# Patient Record
Sex: Female | Born: 1940
Health system: Southern US, Community
[De-identification: ages and names within clinical notes are randomized; demographics above are authoritative.]

## PROBLEM LIST (undated history)

## (undated) DIAGNOSIS — I10 Essential (primary) hypertension: Secondary | ICD-10-CM

## (undated) DIAGNOSIS — J449 Chronic obstructive pulmonary disease, unspecified: Secondary | ICD-10-CM

## (undated) DIAGNOSIS — F32A Depression, unspecified: Secondary | ICD-10-CM

## (undated) DIAGNOSIS — G4730 Sleep apnea, unspecified: Secondary | ICD-10-CM

## (undated) HISTORY — DX: Sleep apnea, unspecified: G47.30

## (undated) HISTORY — PX: ABDOMINAL HYSTERECTOMY: SHX81

## (undated) HISTORY — PX: BACK SURGERY: SHX140

## (undated) HISTORY — DX: Depression, unspecified: F32.A

---

## 2011-05-17 ENCOUNTER — Ambulatory Visit: Payer: Self-pay | Admitting: Internal Medicine

## 2014-03-19 ENCOUNTER — Ambulatory Visit: Payer: Self-pay | Admitting: Emergency Medicine

## 2021-01-29 ENCOUNTER — Other Ambulatory Visit: Payer: Self-pay

## 2021-01-29 ENCOUNTER — Ambulatory Visit
Admission: EM | Admit: 2021-01-29 | Discharge: 2021-01-29 | Disposition: A | Discharge status: Home / Self Care | Payer: BLUE CROSS/BLUE SHIELD

## 2021-01-29 ENCOUNTER — Encounter: Payer: Self-pay | Admitting: Emergency Medicine

## 2021-01-29 DIAGNOSIS — M546 Pain in thoracic spine: Secondary | ICD-10-CM | POA: Diagnosis not present

## 2021-01-29 DIAGNOSIS — M25552 Pain in left hip: Secondary | ICD-10-CM

## 2021-01-29 DIAGNOSIS — M25512 Pain in left shoulder: Secondary | ICD-10-CM | POA: Diagnosis not present

## 2021-01-29 DIAGNOSIS — M5432 Sciatica, left side: Secondary | ICD-10-CM | POA: Diagnosis not present

## 2021-01-29 DIAGNOSIS — J189 Pneumonia, unspecified organism: Secondary | ICD-10-CM

## 2021-01-29 HISTORY — DX: Essential (primary) hypertension: I10

## 2021-01-29 HISTORY — DX: Chronic obstructive pulmonary disease, unspecified: J44.9

## 2021-01-29 MED ORDER — BACLOFEN 10 MG PO TABS: 10.0000 mg | ORAL_TABLET | Freq: Every evening | ORAL | 0 refills | Status: AC

## 2021-01-29 MED ORDER — DICLOFENAC SODIUM 50 MG PO TBEC: 50.0000 mg | DELAYED_RELEASE_TABLET | Freq: Two times a day (BID) | ORAL | 0 refills | Status: AC

## 2021-01-29 MED ORDER — TRAMADOL HCL 50 MG PO TABS: 50.0000 mg | ORAL_TABLET | Freq: Every evening | ORAL | 0 refills | Status: AC

## 2021-01-29 NOTE — Discharge Instructions (Addendum)
BACK PAIN: Stressed avoiding painful activities . RICE (REST, ICE, COMPRESSION, ELEVATION) guidelines reviewed. May alternate ice and heat. Consider use of muscle rubs, Salonpas patches, etc. Use medications as directed including muscle relaxers if prescribed. Take anti-inflammatory medications as prescribed or OTC NSAIDs/Tylenol.  F/u with PCP in 7-10 days for reexamination, and please feel free to call or return to the urgent care at any time for any questions or concerns you may have and we will be happy to help you!   BACK PAIN RED FLAGS: If the back pain acutely worsens or there are any red flag symptoms such as numbness/tingling, leg weakness, saddle anesthesia, or loss of bowel/bladder control, go immediately to the ER. Follow up with [place] as scheduled or sooner if the pain does not begin to resolve or if it worsens before the follow up    PLEASE FOLLOW UP WITH YOUR PCP WHEN YOU GET HOME. DO NOT TAKE ULTRAM OR BACLOFEN WHEN DRIVING, TAKE AT NIGHT TIME ONLY. YOU CAN TAKE DICLOFENAC AND TYLENOL DURING DAY AND USE LIDOCAINE PATCHES AND SALONPAS.
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
## 2021-05-19 ENCOUNTER — Other Ambulatory Visit: Payer: Self-pay

## 2021-05-19 ENCOUNTER — Ambulatory Visit (INDEPENDENT_AMBULATORY_CARE_PROVIDER_SITE_OTHER): Payer: Medicare Other | Admitting: Internal Medicine

## 2021-05-19 ENCOUNTER — Encounter: Payer: Self-pay | Admitting: Internal Medicine

## 2021-05-19 VITALS — BP 154/53 | HR 81 | Temp 97.7°F | Resp 20 | Ht 62.5 in | Wt 154.6 lb

## 2021-05-19 DIAGNOSIS — F5104 Psychophysiologic insomnia: Secondary | ICD-10-CM | POA: Diagnosis not present

## 2021-05-19 DIAGNOSIS — J449 Chronic obstructive pulmonary disease, unspecified: Secondary | ICD-10-CM | POA: Insufficient documentation

## 2021-05-19 DIAGNOSIS — I10 Essential (primary) hypertension: Secondary | ICD-10-CM

## 2021-05-19 DIAGNOSIS — E663 Overweight: Secondary | ICD-10-CM | POA: Diagnosis not present

## 2021-05-19 DIAGNOSIS — J410 Simple chronic bronchitis: Secondary | ICD-10-CM | POA: Diagnosis not present

## 2021-05-19 DIAGNOSIS — K219 Gastro-esophageal reflux disease without esophagitis: Secondary | ICD-10-CM | POA: Diagnosis not present

## 2021-05-19 DIAGNOSIS — F32A Depression, unspecified: Secondary | ICD-10-CM | POA: Diagnosis not present

## 2021-05-19 DIAGNOSIS — N3281 Overactive bladder: Secondary | ICD-10-CM | POA: Diagnosis not present

## 2021-05-19 DIAGNOSIS — Z6827 Body mass index (BMI) 27.0-27.9, adult: Secondary | ICD-10-CM | POA: Insufficient documentation

## 2021-05-19 DIAGNOSIS — F419 Anxiety disorder, unspecified: Secondary | ICD-10-CM

## 2021-05-19 DIAGNOSIS — G4700 Insomnia, unspecified: Secondary | ICD-10-CM | POA: Insufficient documentation

## 2021-05-19 DIAGNOSIS — G4733 Obstructive sleep apnea (adult) (pediatric): Secondary | ICD-10-CM | POA: Diagnosis not present

## 2021-05-19 MED ORDER — DIAZEPAM 2 MG PO TABS: 2.0000 mg | ORAL_TABLET | Freq: Every day | ORAL | 0 refills | Status: DC | PRN

## 2021-05-19 MED ORDER — SERTRALINE HCL 100 MG PO TABS: 150.0000 mg | ORAL_TABLET | Freq: Every day | ORAL | 0 refills | Status: AC

## 2021-05-19 MED ORDER — LISINOPRIL 20 MG PO TABS: 20.0000 mg | ORAL_TABLET | Freq: Every day | ORAL | 0 refills | Status: AC

## 2021-05-19 MED ORDER — ALBUTEROL SULFATE HFA 108 (90 BASE) MCG/ACT IN AERS: 1.0000 | INHALATION_SPRAY | RESPIRATORY_TRACT | 2 refills | Status: AC | PRN

## 2021-05-19 MED ORDER — TRAZODONE HCL 150 MG PO TABS: 150.0000 mg | ORAL_TABLET | Freq: Every day | ORAL | 2 refills | Status: AC

## 2021-05-19 NOTE — Assessment & Plan Note (Signed)
Trazodone refilled today.  

## 2021-05-19 NOTE — Assessment & Plan Note (Signed)
She should take Symbicort but is not willing to use Continue Albuterol as needed, refilled today

## 2021-05-19 NOTE — Assessment & Plan Note (Signed)
Encouraged diet and exercise for weight loss ?

## 2021-05-19 NOTE — Assessment & Plan Note (Signed)
Secondary to complicated grief She declines referral for grief counseling Increase Sertraline 250 mg daily Continue Hydroxyzine as needed Rx for Valium 2 mg daily as needed for panic attacks, discussed sedation and addiction caution. Support offered

## 2021-05-19 NOTE — Patient Instructions (Signed)
Complicated Grief Grief is a normal response to the death of someone close to you. Feelings of fear, anger, and guilt can affect almost everyone who loses a loved one. It is also common to have symptoms of depression while you are grieving. These include problems with sleep, loss of appetite, and lack of energy. They maylast for weeks or months after a loss. Complicated grief is different from normal grief or depression. Normal grieving involves sadness and feelings of loss, but those feelings get better and heal over time. Complicated grief is a severe type of grief that lasts for a long time, usually for several months to a year or longer. It interferes with your ability to function normally. Complicated grief may require treatment from amental health care provider. What are the causes? The cause of this condition is not known. It is not clear why some peoplecontinue to struggle with grief and others do not. What increases the risk? You are more likely to develop this condition if: The death of your loved one was sudden or unexpected. The death of your loved one was due to a violent event. Your loved one died from suicide. Your loved one was a child or a young person. You were very close to your loved one, or you were dependent on him or her. You have a history of depression or anxiety. What are the signs or symptoms? Symptoms of this condition include: Feeling disbelief or having a lack of emotion (numbness). Being unable to enjoy good memories of your loved one. Needing to avoid anything or anyone that reminds you of your loved one. Being unable to stop thinking about the death. Feeling intense anger or guilt. Feeling alone and hopeless. Feeling that your life is meaningless and empty. Losing the desire to move on with your life. How is this diagnosed? This condition may be diagnosed based on: Your symptoms. Complicated grief will be diagnosed if you have ongoing symptoms of grief for  6-12 months or longer. The effect of symptoms on your life. You may be diagnosed with this condition if your symptoms are interfering with your ability to live your life. Your health care provider may recommend that you see a mental health care provider. Many symptoms of depression are similar to the symptoms of complicated grief. It is important to be evaluated for complicated grief alongwith other mental health conditions. How is this treated? This condition is most commonly treated with talk therapy. This therapy is offered by a mental health specialist (psychiatrist). During therapy: You will learn healthy ways to cope with the loss of your loved one. Your mental health care provider may recommend antidepressant medicines. Follow these instructions at home: Lifestyle  Take care of yourself. Eat on a regular basis, and maintain a healthy diet. Eat plenty of fruits, vegetables, lean protein, and whole grains. Try to get some exercise each day. Aim for 30 minutes of exercise on most days of the week. Keep a consistent sleep schedule. Try to get 8 or more hours of sleep each night. Start doing the things that you used to enjoy. Do not use drugs or alcohol to ease your symptoms. Spend time with friends and loved ones.  General instructions Take over-the-counter and prescription medicines only as told by your health care provider. Consider joining a grief (bereavement) support group to help you deal with your loss. Keep all follow-up visits as told by your health care provider. This is important. Contact a health care provider if: Your symptoms prevent   you from functioning normally. Your symptoms do not get better with treatment. Get help right away if: You have serious thoughts about hurting yourself or someone else. You have suicidal feelings. If you ever feel like you may hurt yourself or others, or have thoughts about taking your own life, get help right away. You can go to your nearest  emergency department or call: Your local emergency services (911 in the [place] A suicide crisis helpline, such as the National Suicide Prevention Lifeline at 1-800-273-8255. This is open 24 hours a day. Summary Complicated grief is a severe type of grief that lasts for a long time. This grief is not likely to go away on its own. Get the help you need. Some griefs are more difficult than others and can cause this condition. You may need a certain type of treatment to help you recover if the loss of your loved one was sudden, violent, or due to suicide. You may feel guilty about moving on with your life. Getting help does not mean that you are forgetting your loved one. It means that you are taking care of yourself. Complicated grief is best treated with talk therapy. Medicines may also be prescribed. Seek the help you need, and find support that will help you recover. This information is not intended to replace advice given to you by your health care provider. Make sure you discuss any questions you have with your healthcare provider. Document Revised: 04/17/2020 Document Reviewed: 04/17/2020 Elsevier Patient Education  2022 [employer]  

## 2021-05-19 NOTE — Assessment & Plan Note (Signed)
Noncompliant with CPAP 

## 2021-05-19 NOTE — Assessment & Plan Note (Signed)
Continue Ditropan 

## 2021-05-19 NOTE — Progress Notes (Signed)
HPI  Pt presents to the clinic today to establish care and for management of the conditions listed  below. She recently moved from [place].   COPD: She reports chronic shortness of breath. She is no longer taking Symbicort. She takes Albuterol as needed. She no longer smokes. There are no PFT's on file.  OSA: She averages 4 hours of sleep per nigh without the use of her CPAP. She reports she can not tolerate it. There is no sleep study on file.  Insomnia: She has trouble falling and staying asleep. She was taking Trazadone with some good relief, but reports she is out and would like this refilled today. There is no sleep study on file.  Anxiety and Depression: She has lack of motivation, crying spells. She feels anxious and on edge. This started after the death of her daughter and now she is raising her 2 great grandson's, 8 and 10.  She is not interested in seeing a grief counselor. She is taking Sertraline and Hydroxyzine as prescribed. She denies SI/HI.  GERD: She is not sure what triggers this. She denies breakthrough on Esomeprazole. There is no upper GI on file.  HTN: Her BP today is 154/53. She was taking Lisinopril as prescribed but reports she ran out 3 days ago. There is no ECG on file.  OAB: She reports urge and stress incontinence. She is taking Ditropan as prescribed.  Past Medical History:  Diagnosis Date   COPD (chronic obstructive pulmonary disease) (HCC)    Depression    Hypertension    Sleep apnea     Current Outpatient Medications  Medication Sig Dispense Refill   albuterol (VENTOLIN HFA) 108 (90 Base) MCG/ACT inhaler      atorvastatin (LIPITOR) 40 MG tablet Take 40 mg by mouth daily.     esomeprazole (NEXIUM) 40 MG capsule 1 capsule     fluticasone (FLONASE) 50 MCG/ACT nasal spray 1 spray by Each Nare route daily.     hydrOXYzine (ATARAX/VISTARIL) 25 MG tablet 1 tablet as needed     lisinopril (ZESTRIL) 20 MG tablet 1 tablet     oxybutynin (DITROPAN-XL) 10 MG 24  hr tablet Take 10 mg by mouth daily.     sertraline (ZOLOFT) 100 MG tablet Take 100 mg by mouth daily.     traZODone (DESYREL) 150 MG tablet Take 150 mg by mouth at bedtime.     budesonide-formoterol (SYMBICORT) 160-4.5 MCG/ACT inhaler 2 puffs (Patient not taking: Reported on 05/19/2021)     No current facility-administered medications for this visit.    Allergies  Allergen Reactions   Codeine    Sulfa Antibiotics     No family history on file.  Social History   Socioeconomic History   Marital status: Single    Spouse name: Not on file   Number of children: Not on file   Years of education: Not on file   Highest education level: Not on file  Occupational History   Not on file  Tobacco Use   Smoking status: Never   Smokeless tobacco: Never  Vaping Use   Vaping Use: Never used  Substance and Sexual Activity   Alcohol use: Never   Drug use: Never   Sexual activity: Not on file  Other Topics Concern   Not on file  Social History Narrative   Not on file   Social Determinants of Health   Financial Resource Strain: Not on file  Food Insecurity: Not on file  Transportation Needs: Not on file  Physical Activity: Not on file  Stress: Not on file  Social Connections: Not on file  Intimate Partner Violence: Not on file    ROS:  Constitutional: Denies fever, malaise, fatigue, headache or abrupt weight changes.  HEENT: Denies eye pain, eye redness, ear pain, ringing in the ears, wax buildup, runny nose, nasal congestion, bloody nose, or sore throat. Respiratory: Pt reports shortness of breath. Denies difficulty breathing, cough or sputum production.   Cardiovascular: Denies chest pain, chest tightness, palpitations or swelling in the hands or feet.  Gastrointestinal: Denies abdominal pain, bloating, constipation, diarrhea or blood in the stool.  GU: Pt reports urge and stress incontinence. Denies frequency, pain with urination, blood in urine, odor or  discharge. Musculoskeletal: Denies decrease in range of motion, difficulty with gait, muscle pain or joint pain and swelling.  Skin: Denies redness, rashes, lesions or ulcercations.  Neurological: Denies dizziness, difficulty with memory, difficulty with speech or problems with balance and coordination.  Psych: Pt reports anxiety and depression. Denies SI/HI.  No other specific complaints in a complete review of systems (except as listed in HPI above).  PE:  BP (!) 154/53 (BP Location: Left Arm, Patient Position: Sitting, Cuff Size: Normal)   Pulse 81   Temp 97.7 F (36.5 C) (Temporal)   Resp 20   Ht 5' 2.5" (1.588 m)   Wt 154 lb 9.6 oz (70.1 kg)   SpO2 96%   BMI 27.83 kg/m  Wt Readings from Last 3 Encounters:  05/19/21 154 lb 9.6 oz (70.1 kg)  01/29/21 165 lb (74.8 kg)    General: Appears her stated age, chronically ill appearing, in NAD. HEENT: Head: normal shape and size; Eyes: sclera white and EOMs intact;  Neck: Neck supple, trachea midline. No masses, lumps or thyromegaly present.  Cardiovascular: Normal rate and rhythm. S1,S2 noted.  No murmur, rubs or gallops noted.  Pulmonary/Chest: Normal effort and positive vesicular breath sounds. No respiratory distress. No wheezes, rales or ronchi noted.  Abdomen: Soft and nontender. Normal bowel sounds. Musculoskeletal: No difficulty with gait.  Neurological: Alert and oriented.  Psychiatric: Tearful. Judgment and thought content normal.     Assessment and Plan:   Nicki Reaper, NP This visit occurred during the SARS-CoV-2 public health emergency.  Safety protocols were in place, including screening questions prior to the visit, additional usage of staff PPE, and extensive cleaning of exam room while observing appropriate contact time as indicated for disinfecting solutions.

## 2021-05-19 NOTE — Assessment & Plan Note (Signed)
Continue Esomeprazole

## 2021-05-19 NOTE — Assessment & Plan Note (Signed)
Uncontrolled off meds Lisinopril refilled today Reinforced DASH diet and exercise for weight loss

## 2021-05-20 ENCOUNTER — Other Ambulatory Visit: Payer: Self-pay | Admitting: Internal Medicine

## 2021-05-20 MED ORDER — DIAZEPAM 2 MG PO TABS: 2.0000 mg | ORAL_TABLET | Freq: Every day | ORAL | 0 refills | Status: AC | PRN

## 2021-05-20 NOTE — Telephone Encounter (Signed)
Copied from CRM [id number]. Topic: General - Other >> May 19, 2021  2:18 PM Jaquita Rector A wrote: Reason for CRM: [facility] called called in to inform Nicki Reaper that patient have changed to them and need her diazepam (VALIUM) 2 MG tablet sent over to them please since it cant be transferred

## 2021-05-20 NOTE — Telephone Encounter (Signed)
Pt called and stated she uses [facility] and not [facility] for her meds/ pt was able to have her meds transferred using the pharmacy except for diazepam (VALIUM) 2 MG tablet Pt needs this Rx sent to [facility]/ please advise

## 2021-05-20 NOTE — Telephone Encounter (Signed)
I have sent to [facility]. Please call previous pharmacy to cancel as this is a controlled substance.

## 2021-05-22 NOTE — Telephone Encounter (Signed)
I called and disconnected the prescription at the [facility] [place], [place]. For the Diazepam.

## 2021-05-28 ENCOUNTER — Ambulatory Visit: Payer: Self-pay | Admitting: *Deleted

## 2021-05-28 NOTE — Telephone Encounter (Signed)
Reason for Disposition  [1] Thigh, calf, or ankle swelling AND [2] bilateral AND [3] 1 side is more swollen  Answer Assessment - Initial Assessment Questions 1. ONSET: "When did the swelling start?" (e.g., minutes, hours, days)     Yesterday  2. LOCATION: "What part of the leg is swollen?"  "Are both legs swollen or just one leg?"     Bilateral legs feet to knees, left worse than right 3. SEVERITY: "How bad is the swelling?" (e.g., localized; mild, moderate, severe)  - Localized - small area of swelling localized to one leg  - MILD pedal edema - swelling limited to foot and ankle, pitting edema < 1/4 inch (6 mm) deep, rest and elevation eliminate most or all swelling  - MODERATE edema - swelling of lower leg to knee, pitting edema > 1/4 inch (6 mm) deep, rest and elevation only partially reduce swelling  - SEVERE edema - swelling extends above knee, facial or hand swelling present      Mild . Left calf swelling redness and tender to touch  4. REDNESS: "Does the swelling look red or infected?"     Redness to left calf, red rash to right foot 5. PAIN: "Is the swelling painful to touch?" If Yes, ask: "How painful is it?"   (Scale 1-10; mild, moderate or severe)     Yes  6. FEVER: "Do you have a fever?" If Yes, ask: "What is it, how was it measured, and when did it start?"      Denies  7. CAUSE: "What do you think is causing the leg swelling?"     Not sure  8. MEDICAL HISTORY: "Do you have a history of heart failure, kidney disease, liver failure, or cancer?"     Denies  9. RECURRENT SYMPTOM: "Have you had leg swelling before?" If Yes, ask: "When was the last time?" "What happened that time?"     no 10. OTHER SYMPTOMS: "Do you have any other symptoms?" (e.g., chest pain, difficulty breathing)       No chest pain or difficulty breathing, c/o cough and upper resp. Sx scratchy sore throat  11. PREGNANCY: "Is there any chance you are pregnant?" "When was your last menstrual period?"        na  Protocols used: Leg Swelling and Edema-A-AH

## 2021-05-28 NOTE — Telephone Encounter (Signed)
Agree with advice given, since there are no appt available here

## 2021-05-28 NOTE — Telephone Encounter (Signed)
C/o  bilateral legs swelling left greater than right. Feet to knees swollen. Left calf swelling redness and tender to touch. Right top of foot noted with red rash, no itching. Swelling started yesterday. Patient reports "a little tender to stand on left leg" but she is "not worried about it'. Patient reports she is more concerned that she has a scratchy sore throat and getting upper respiratory infection. C/o coughing up light yellow sputum. Hx COPD. Denies fever, CP , SOB or difficulty breathing. No available appt with PCP today or tomorrow. Instructed patient to go to [facility] or ED now. Care advise given. Patient verbalized understanding of care advise and to go to [facility] or  ED or call 911 if symptoms worsen.

## 2021-06-01 ENCOUNTER — Other Ambulatory Visit: Payer: Self-pay

## 2021-06-01 ENCOUNTER — Ambulatory Visit
Admission: RE | Admit: 2021-06-01 | Discharge: 2021-06-01 | Disposition: A | Discharge status: Home / Self Care | Payer: Medicare Other | Source: Ambulatory Visit | Attending: Family Medicine | Admitting: Family Medicine

## 2021-06-01 ENCOUNTER — Ambulatory Visit
Admission: EM | Admit: 2021-06-01 | Discharge: 2021-06-01 | Disposition: A | Discharge status: Home / Self Care | Payer: Medicare Other | Attending: Family Medicine | Admitting: Family Medicine

## 2021-06-01 DIAGNOSIS — R600 Localized edema: Secondary | ICD-10-CM

## 2021-06-01 DIAGNOSIS — M79604 Pain in right leg: Secondary | ICD-10-CM | POA: Insufficient documentation

## 2021-06-01 DIAGNOSIS — M79605 Pain in left leg: Secondary | ICD-10-CM | POA: Diagnosis not present

## 2021-06-01 NOTE — Discharge Instructions (Addendum)
[place] at the hospital.   I will call with the results.  Take care  Dr. Adriana Simas

## 2021-06-01 NOTE — ED Provider Notes (Signed)
[facility]    CSN: [id number] Arrival date & time: 06/01/21  1105      History   Chief Complaint Chief Complaint  Patient presents with   Foot Pain    HPI  80 year old female presents for evaluation of lower extremity edema and back pain.   Patient reports that she has some baseline swelling in her lower extremities.  She states that it was worse on Thursday.  She contacted her primary care physician and was advised to go to the ER or urgent care for evaluation.  Patient presents today for evaluation of this.  Also reports upper back pain.  Patient denies calf pain.  She states that her swelling in her lower extremities is improved.  Improved after rest elevation.  Patient has been using heat to her upper back without resolution.  Denies injury.  She states that it is worse with range of motion.  No other complaints at this time.  Past Medical History:  Diagnosis Date   COPD (chronic obstructive pulmonary disease) (HCC)    Depression    Hypertension    Sleep apnea     Patient Active Problem List   Diagnosis Date Noted   COPD (chronic obstructive pulmonary disease) (HCC) 05/19/2021   OSA (obstructive sleep apnea) 05/19/2021   Insomnia 05/19/2021   Anxiety and depression 05/19/2021   GERD (gastroesophageal reflux disease) 05/19/2021   HTN (hypertension) 05/19/2021   OAB (overactive bladder) 05/19/2021   Overweight with body mass index (BMI) of 27 to 27.9 in adult 05/19/2021    Past Surgical History:  Procedure Laterality Date   ABDOMINAL HYSTERECTOMY     BACK SURGERY     L5    OB History   No obstetric history on file.      Home Medications    Prior to Admission medications   Medication Sig Start Date End Date Taking? Authorizing Provider  albuterol (VENTOLIN HFA) 108 (90 Base) MCG/ACT inhaler Inhale 1-2 puffs into the lungs every 4 (four) hours as needed for wheezing or shortness of breath. 05/19/21  Yes Lorre Munroe, NP  atorvastatin  (LIPITOR) 40 MG tablet Take 40 mg by mouth daily.   Yes [provider]  diazepam (VALIUM) 2 MG tablet Take 1 tablet (2 mg total) by mouth daily as needed for anxiety. 05/20/21  Yes Lorre Munroe, NP  esomeprazole (NEXIUM) 40 MG capsule 1 capsule 04/03/14  Yes [provider]  fluticasone (FLONASE) 50 MCG/ACT nasal spray 1 spray by Each Nare route daily.   Yes [provider]  hydrOXYzine (ATARAX/VISTARIL) 25 MG tablet 1 tablet as needed 03/11/20  Yes [provider]  lisinopril (ZESTRIL) 20 MG tablet Take 1 tablet (20 mg total) by mouth daily. 05/19/21  Yes Lorre Munroe, NP  oxybutynin (DITROPAN-XL) 10 MG 24 hr tablet Take 10 mg by mouth daily. 02/27/21  Yes [provider]  sertraline (ZOLOFT) 100 MG tablet Take 1.5 tablets (150 mg total) by mouth daily. 05/19/21  Yes Lorre Munroe, NP  traZODone (DESYREL) 150 MG tablet Take 1 tablet (150 mg total) by mouth at bedtime. 05/19/21  Yes Lorre Munroe, NP    Family History No family history on file.  Social History Social History   Tobacco Use   Smoking status: Never   Smokeless tobacco: Never  Vaping Use   Vaping Use: Never used  Substance Use Topics   Alcohol use: Never   Drug use: Never  Allergies   Codeine and Sulfa antibiotics   Review of Systems Review of Systems Per HPI  Physical Exam Triage Vital Signs ED Triage Vitals [06/01/21 1147]  Enc Vitals Group     BP (!) 199/64     Pulse Rate 71     Resp 16     Temp 98.2 F (36.8 C)     Temp Source Oral     SpO2 96 %     Weight 170 lb (77.1 kg)     Height      Head Circumference      Peak Flow      Pain Score 7     Pain Loc      Pain Edu?      Excl. in GC?    Updated Vital Signs BP (!) 199/64 (BP Location: Right Arm)   Pulse 71   Temp 98.2 F (36.8 C) (Oral)   Resp 16   Wt 77.1 kg   SpO2 96%   BMI 30.60 kg/m   Visual Acuity Right Eye Distance:   Left Eye Distance:   Bilateral Distance:    Right Eye  Near:   Left Eye Near:    Bilateral Near:     Physical Exam Vitals and nursing note reviewed.  Constitutional:      General: She is not in acute distress.    Appearance: She is not ill-appearing.  HENT:     Head: Normocephalic and atraumatic.  Cardiovascular:     Rate and Rhythm: Normal rate and regular rhythm.  Pulmonary:     Effort: Pulmonary effort is normal.     Breath sounds: Normal breath sounds. No wheezing, rhonchi or rales.  Musculoskeletal:     Comments: Bilateral lower extremities with pitting edema and evidence of chronic changes associated with venous insufficiency.  No significant calf pain on exam.  T spine - mild tenderness of the left paraspinal musculature.   Neurological:     Mental Status: She is alert.  Psychiatric:        Mood and Affect: Mood normal.        Behavior: Behavior normal.    UC Treatments / Results  Labs (all labs ordered are listed, but only abnormal results are displayed) Labs Reviewed - No data to display  EKG   Radiology US Venous Img Lower Bilateral (DVT)  Result Date: 06/01/2021 CLINICAL DATA:  Lower extremity pain and edema for 4 days EXAM: BILATERAL LOWER EXTREMITY VENOUS DOPPLER ULTRASOUND TECHNIQUE: Gray-scale sonography with graded compression, as well as color Doppler and duplex ultrasound were performed to evaluate the lower extremity deep venous systems from the level of the common femoral vein and including the common femoral, femoral, profunda femoral, popliteal and calf veins including the posterior tibial, peroneal and gastrocnemius veins when visible. The superficial great saphenous vein was also interrogated. Spectral Doppler was utilized to evaluate flow at rest and with distal augmentation maneuvers in the common femoral, femoral and popliteal veins. COMPARISON:  None. FINDINGS: RIGHT LOWER EXTREMITY Common Femoral Vein: No evidence of thrombus. Normal compressibility, respiratory phasicity and response to augmentation.  Saphenofemoral Junction: No evidence of thrombus. Normal compressibility and flow on color Doppler imaging. Profunda Femoral Vein: No evidence of thrombus. Normal compressibility and flow on color Doppler imaging. Femoral Vein: No evidence of thrombus. Normal compressibility, respiratory phasicity and response to augmentation. Popliteal Vein: No evidence of thrombus. Normal compressibility, respiratory phasicity and response to augmentation. Calf Veins: No evidence of thrombus. Normal compressibility and flow  on color Doppler imaging. LEFT LOWER EXTREMITY Common Femoral Vein: No evidence of thrombus. Normal compressibility, respiratory phasicity and response to augmentation. Saphenofemoral Junction: No evidence of thrombus. Normal compressibility and flow on color Doppler imaging. Profunda Femoral Vein: No evidence of thrombus. Normal compressibility and flow on color Doppler imaging. Femoral Vein: No evidence of thrombus. Normal compressibility, respiratory phasicity and response to augmentation. Popliteal Vein: No evidence of thrombus. Normal compressibility, respiratory phasicity and response to augmentation. Calf Veins: No evidence of thrombus. Normal compressibility and flow on color Doppler imaging. IMPRESSION: No evidence of deep venous thrombosis in either lower extremity. Electronically Signed   By: Judie Petit.  Shick M.D.   On: 06/01/2021 14:44    Procedures Procedures (including critical care time)  Medications Ordered in UC Medications - No data to display  Initial Impression / Assessment and Plan / UC Course  I have reviewed the triage vital signs and the nursing notes.  Pertinent labs & imaging results that were available during my care of the patient were reviewed by me and considered in my medical decision making (see chart for details).    80 year old female presents with recent worsening of her lower extremity edema.  Also experiencing upper back pain.  Patient was sent for venous ultrasound  which was negative for DVT.  Patient appears to be experiencing venous stasis.  Advised rest and leg elevation.  In regards to her back pain, this appears to be musculoskeletal.  Advised heat and supportive care.  Final Clinical Impressions(s) / UC Diagnoses   Final diagnoses:  Lower extremity edema     Discharge Instructions      [place] at the hospital.   I will call with the results.  Take care  Dr. Adriana Simas    ED Prescriptions   None    PDMP not reviewed this encounter.   Tommie Sams, [place] 06/01/21 2014

## 2021-06-01 NOTE — ED Triage Notes (Signed)
Pt c/o bilateral leg and ankle swelling that started Friday. Today c/o mid back pain that started today, pulling sensation

## 2021-07-01 DIAGNOSIS — J069 Acute upper respiratory infection, unspecified: Secondary | ICD-10-CM | POA: Diagnosis not present

## 2021-07-01 DIAGNOSIS — R112 Nausea with vomiting, unspecified: Secondary | ICD-10-CM | POA: Diagnosis not present

## 2021-07-06 DIAGNOSIS — J209 Acute bronchitis, unspecified: Secondary | ICD-10-CM | POA: Diagnosis not present

## 2021-07-27 DIAGNOSIS — J069 Acute upper respiratory infection, unspecified: Secondary | ICD-10-CM | POA: Diagnosis not present

## 2021-08-19 ENCOUNTER — Ambulatory Visit: Payer: Medicare Other | Admitting: Internal Medicine

## 2021-08-19 NOTE — Progress Notes (Deleted)
Subjective:    Patient ID: Brenda Orozco, female    DOB: 26-Sep-1941, 80 y.o.   MRN: [id number]  HPI  Pt presents to the clinic today for follow up of chronic conditions.    Review of Systems  Past Medical History:  Diagnosis Date   COPD (chronic obstructive pulmonary disease) (HCC)    Depression    Hypertension    Sleep apnea     Current Outpatient Medications  Medication Sig Dispense Refill   albuterol (VENTOLIN HFA) 108 (90 Base) MCG/ACT inhaler Inhale 1-2 puffs into the lungs every 4 (four) hours as needed for wheezing or shortness of breath. 18 g 2   atorvastatin (LIPITOR) 40 MG tablet Take 40 mg by mouth daily.     diazepam (VALIUM) 2 MG tablet Take 1 tablet (2 mg total) by mouth daily as needed for anxiety. 15 tablet 0   esomeprazole (NEXIUM) 40 MG capsule 1 capsule     fluticasone (FLONASE) 50 MCG/ACT nasal spray 1 spray by Each Nare route daily.     hydrOXYzine (ATARAX/VISTARIL) 25 MG tablet 1 tablet as needed     lisinopril (ZESTRIL) 20 MG tablet Take 1 tablet (20 mg total) by mouth daily. 90 tablet 0   oxybutynin (DITROPAN-XL) 10 MG 24 hr tablet Take 10 mg by mouth daily.     sertraline (ZOLOFT) 100 MG tablet Take 1.5 tablets (150 mg total) by mouth daily. 135 tablet 0   traZODone (DESYREL) 150 MG tablet Take 1 tablet (150 mg total) by mouth at bedtime. 30 tablet 2   No current facility-administered medications for this visit.    Allergies  Allergen Reactions   Codeine    Sulfa Antibiotics     No family history on file.  Social History   Socioeconomic History   Marital status: Widowed    Spouse name: Not on file   Number of children: Not on file   Years of education: Not on file   Highest education level: Not on file  Occupational History   Not on file  Tobacco Use   Smoking status: Never   Smokeless tobacco: Never  Vaping Use   Vaping Use: Never used  Substance and Sexual Activity   Alcohol use: Never   Drug use: Never   Sexual activity: Not  on file  Other Topics Concern   Not on file  Social History Narrative   Not on file   Social Determinants of Health   Financial Resource Strain: Not on file  Food Insecurity: Not on file  Transportation Needs: Not on file  Physical Activity: Not on file  Stress: Not on file  Social Connections: Not on file  Intimate Partner Violence: Not on file     Constitutional: Denies fever, malaise, fatigue, headache or abrupt weight changes.  HEENT: Denies eye pain, eye redness, ear pain, ringing in the ears, wax buildup, runny nose, nasal congestion, bloody nose, or sore throat. Respiratory: Denies difficulty breathing, shortness of breath, cough or sputum production.   Cardiovascular: Denies chest pain, chest tightness, palpitations or swelling in the hands or feet.  Gastrointestinal: Denies abdominal pain, bloating, constipation, diarrhea or blood in the stool.  GU: Denies urgency, frequency, pain with urination, burning sensation, blood in urine, odor or discharge. Musculoskeletal: Denies decrease in range of motion, difficulty with gait, muscle pain or joint pain and swelling.  Skin: Denies redness, rashes, lesions or ulcercations.  Neurological: Denies dizziness, difficulty with memory, difficulty with speech or problems with balance and  coordination.  Psych: Denies anxiety, depression, SI/HI.  No other specific complaints in a complete review of systems (except as listed in HPI above).     Objective:   Physical Exam  There were no vitals taken for this visit. Wt Readings from Last 3 Encounters:  06/01/21 170 lb (77.1 kg)  05/19/21 154 lb 9.6 oz (70.1 kg)  01/29/21 165 lb (74.8 kg)    General: Appears their stated age, well developed, well nourished in NAD. Skin: Warm, dry and intact. No rashes, lesions or ulcerations noted. HEENT: Head: normal shape and size; Eyes: sclera white, no icterus, conjunctiva pink, PERRLA and EOMs intact; Ears: Tm's gray and intact, normal light  reflex; Nose: mucosa pink and moist, septum midline; Throat/Mouth: Teeth present, mucosa pink and moist, no exudate, lesions or ulcerations noted.  Neck:  Neck supple, trachea midline. No masses, lumps or thyromegaly present.  Cardiovascular: Normal rate and rhythm. S1,S2 noted.  No murmur, rubs or gallops noted. No JVD or BLE edema. No carotid bruits noted. Pulmonary/Chest: Normal effort and positive vesicular breath sounds. No respiratory distress. No wheezes, rales or ronchi noted.  Abdomen: Soft and nontender. Normal bowel sounds. No distention or masses noted. Liver, spleen and kidneys non palpable. Musculoskeletal: Normal range of motion. No signs of joint swelling. No difficulty with gait.  Neurological: Alert and oriented. Cranial nerves II-XII grossly intact. Coordination normal.  Psychiatric: Mood and affect normal. Behavior is normal. Judgment and thought content normal.        Assessment & Plan:     Nicki Reaper, NP This visit occurred during the SARS-CoV-2 public health emergency.  Safety protocols were in place, including screening questions prior to the visit, additional usage of staff PPE, and extensive cleaning of exam room while observing appropriate contact time as indicated for disinfecting solutions.

## 2022-03-29 ENCOUNTER — Telehealth: Payer: Self-pay

## 2022-03-29 NOTE — Telephone Encounter (Signed)
I called the patient to schedule her for a medicare wellness visit. She informed me that she relocated back to [place] x 1 yr ago. She has establish with a provider in [place], unable to give me the providers name at this time.

## 2022-11-14 IMAGING — US US EXTREM LOW VENOUS
1 series · 13 of 24 positions shown · non-contrast
Comparison: None.

CLINICAL DATA: Lower extremity pain and edema for 4 days



[Series 1: us venous img lower bilat (dvt) · portal-venous · 13 of 58 slices shown]
[im 1/58]
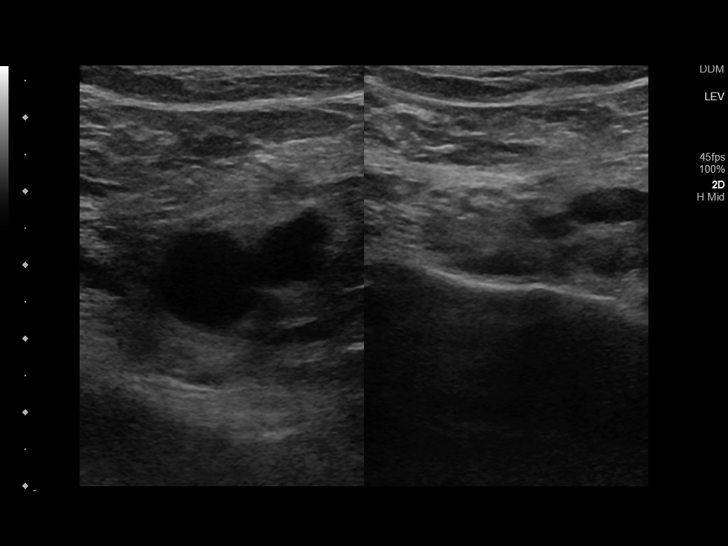
[im 5/58]
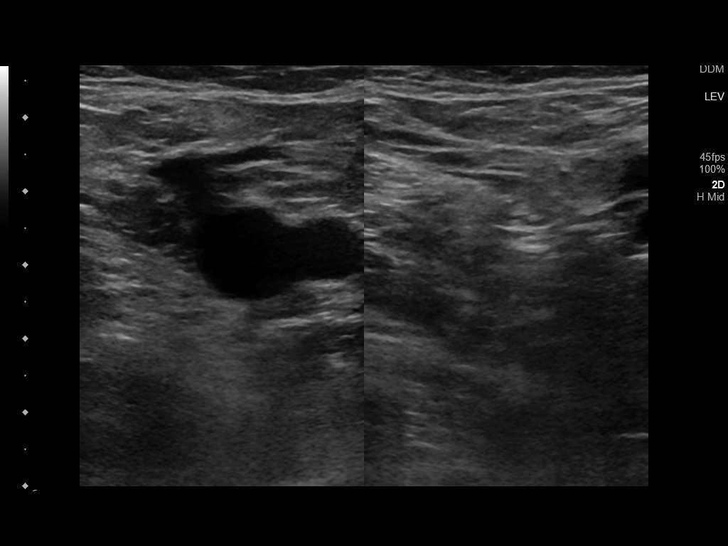
[im 10/58]
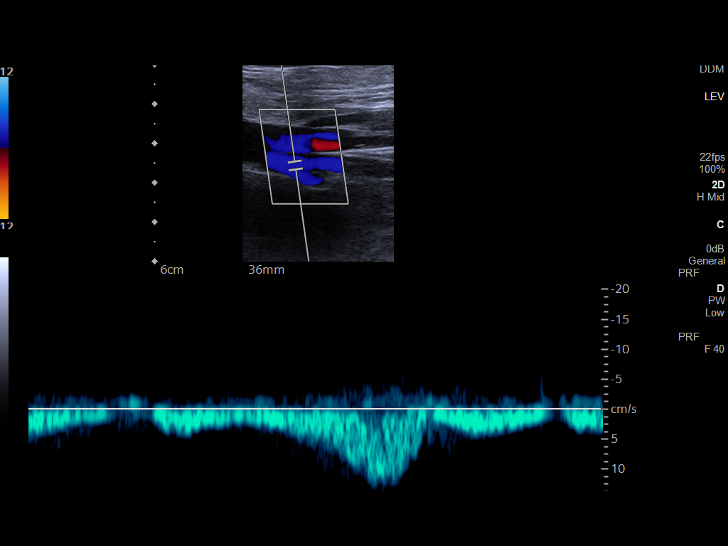
[im 15/58]
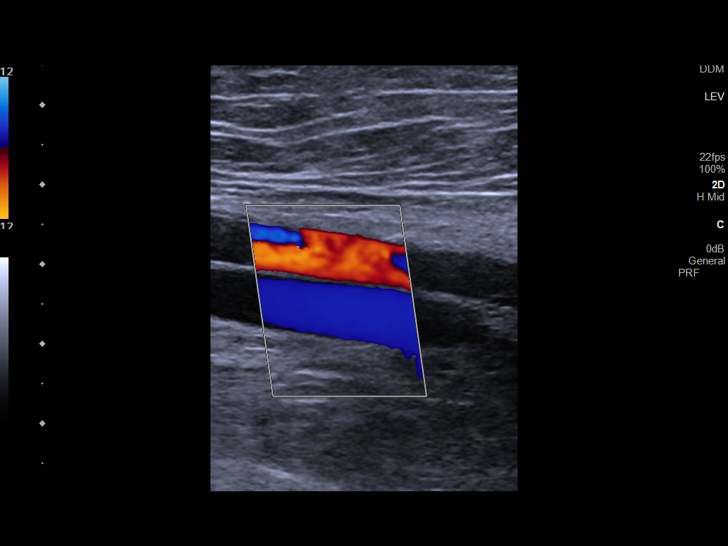
[im 20/58]
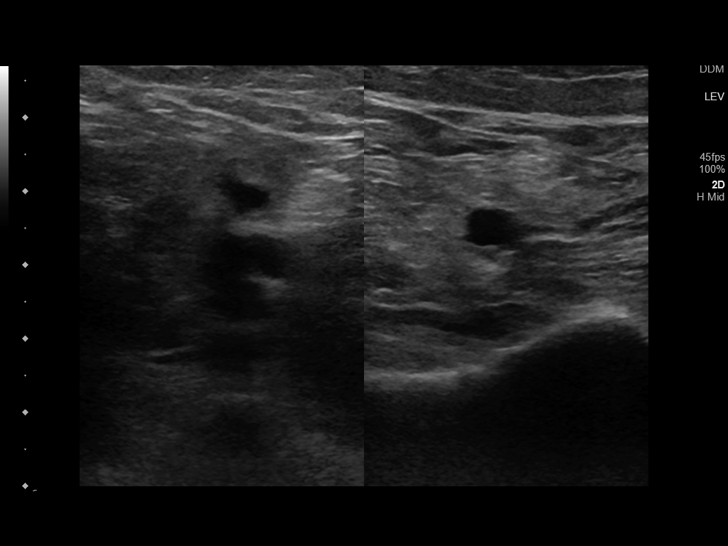
[im 25/58]
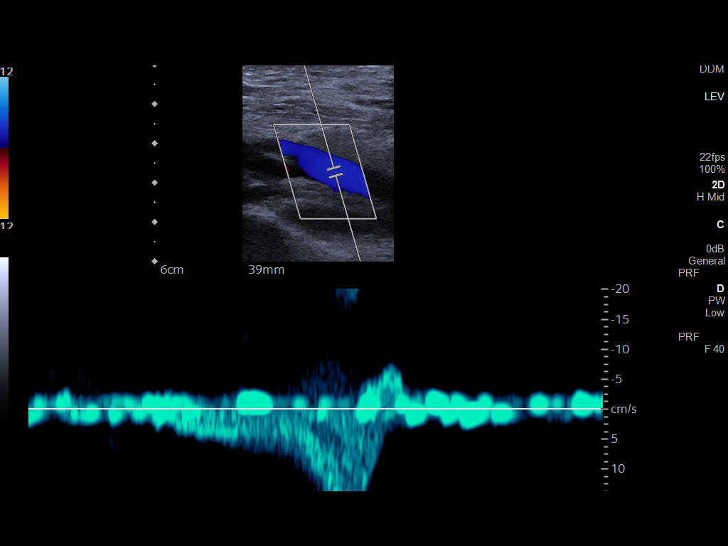
[im 30/58]
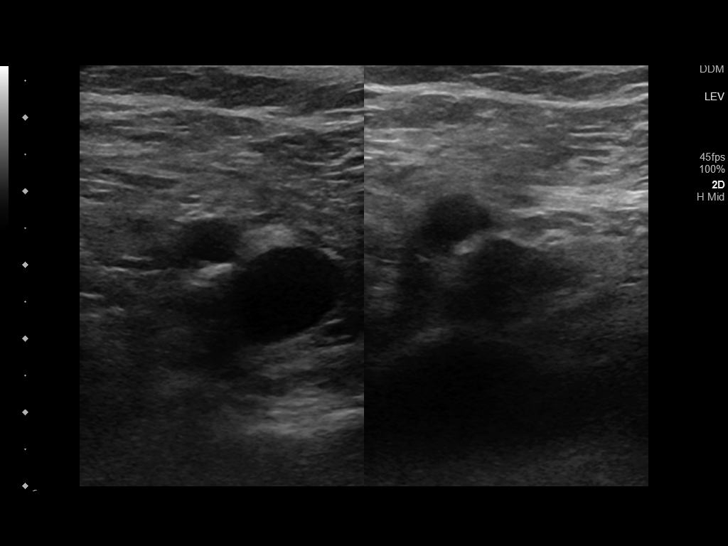
[im 33/58]
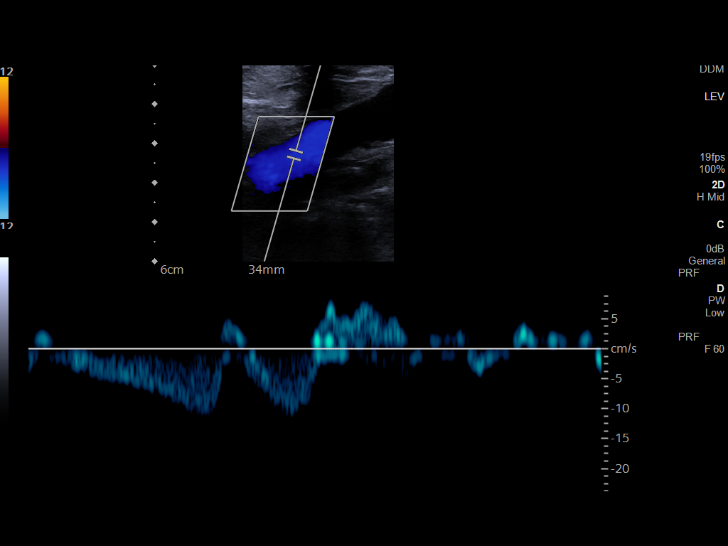
[im 38/58]
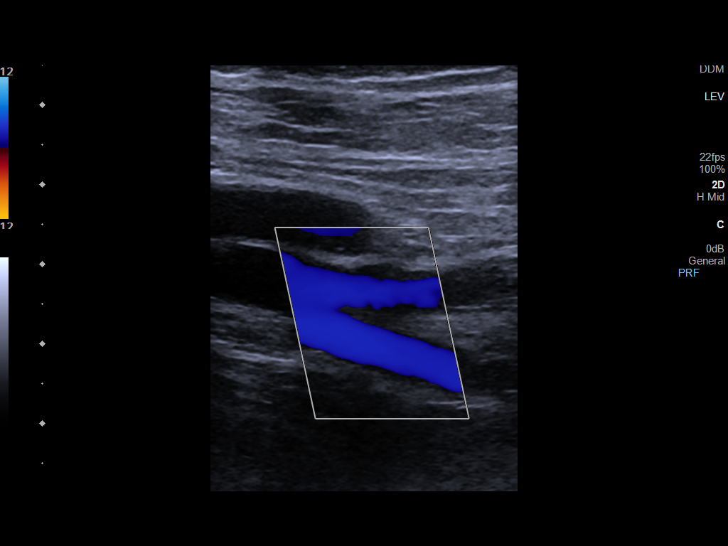
[im 43/58]
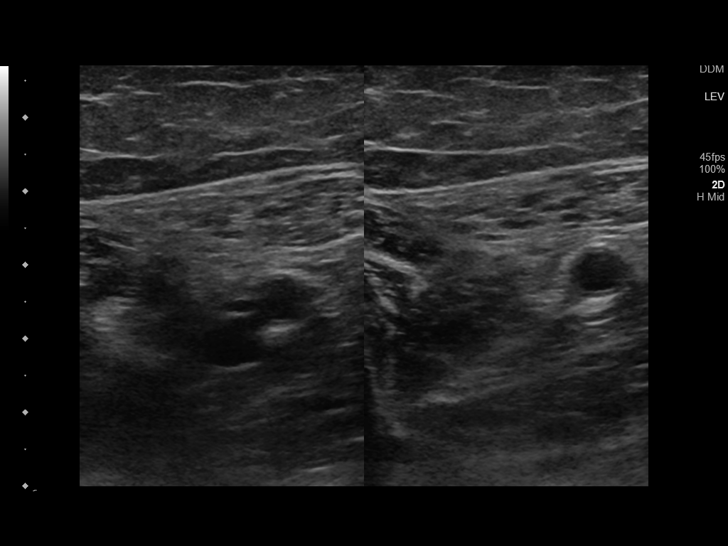
[im 48/58]
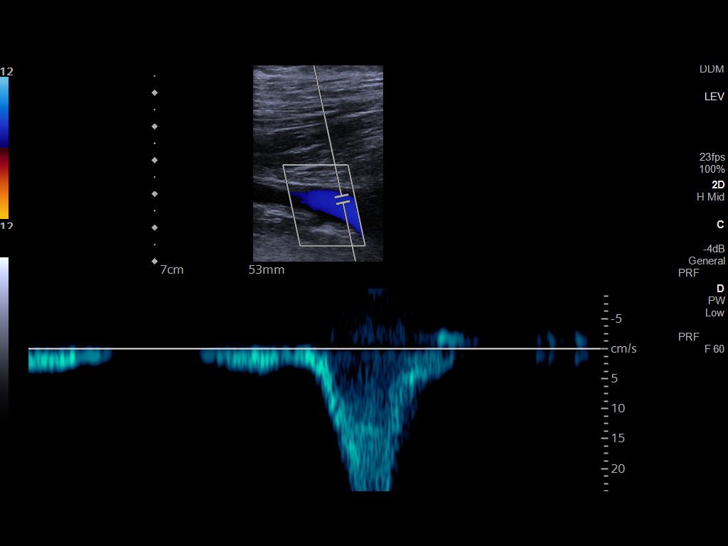
[im 53/58]
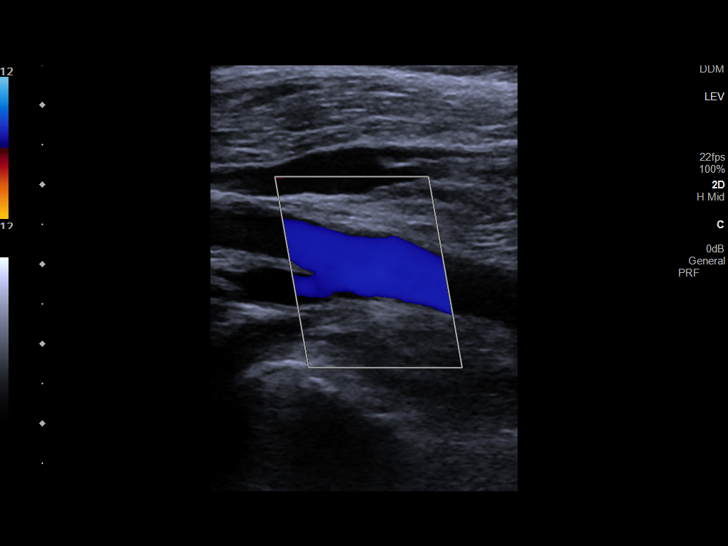
[im 58/58]
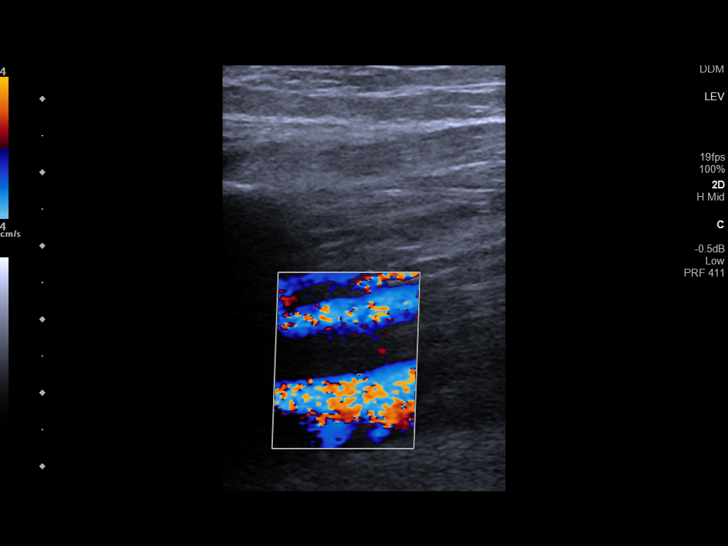

[13 of 24 positions shown; findings below may reference images not displayed]

FINDINGS: RIGHT LOWER EXTREMITY

Common Femoral Vein: No evidence of thrombus. Normal
compressibility, respiratory phasicity and response to augmentation.

Saphenofemoral Junction: No evidence of thrombus. Normal
compressibility and flow on color Doppler imaging.

Profunda Femoral Vein: No evidence of thrombus. Normal
compressibility and flow on color Doppler imaging.

Femoral Vein: No evidence of thrombus. Normal compressibility,
respiratory phasicity and response to augmentation.

Popliteal Vein: No evidence of thrombus. Normal compressibility,
respiratory phasicity and response to augmentation.

Calf Veins: No evidence of thrombus. Normal compressibility and flow
on color Doppler imaging.

LEFT LOWER EXTREMITY

Common Femoral Vein: No evidence of thrombus. Normal
compressibility, respiratory phasicity and response to augmentation.

Saphenofemoral Junction: No evidence of thrombus. Normal
compressibility and flow on color Doppler imaging.

Profunda Femoral Vein: No evidence of thrombus. Normal
compressibility and flow on color Doppler imaging.

Femoral Vein: No evidence of thrombus. Normal compressibility,
respiratory phasicity and response to augmentation.

Popliteal Vein: No evidence of thrombus. Normal compressibility,
respiratory phasicity and response to augmentation.

Calf Veins: No evidence of thrombus. Normal compressibility and flow
on color Doppler imaging.
IMPRESSION: No evidence of deep venous thrombosis in either lower extremity.
